# Patient Record
Sex: Male | Born: 1995 | Race: Black or African American | Hispanic: No | Marital: Single | Smoking: Never smoker
Health system: Southern US, Community
[De-identification: ages and names within clinical notes are randomized; demographics above are authoritative.]

## PROBLEM LIST (undated history)

## (undated) DIAGNOSIS — M109 Gout, unspecified: Secondary | ICD-10-CM

## (undated) DIAGNOSIS — I422 Other hypertrophic cardiomyopathy: Secondary | ICD-10-CM

---

## 2018-07-31 ENCOUNTER — Encounter: Payer: Self-pay | Admitting: Emergency Medicine

## 2018-07-31 ENCOUNTER — Other Ambulatory Visit: Payer: Self-pay

## 2018-07-31 ENCOUNTER — Emergency Department: Payer: Self-pay

## 2018-07-31 ENCOUNTER — Emergency Department
Admission: EM | Admit: 2018-07-31 | Discharge: 2018-07-31 | Disposition: A | Discharge status: Home / Self Care | Payer: Self-pay | Attending: Emergency Medicine | Admitting: Emergency Medicine

## 2018-07-31 DIAGNOSIS — R457 State of emotional shock and stress, unspecified: Secondary | ICD-10-CM

## 2018-07-31 DIAGNOSIS — R079 Chest pain, unspecified: Secondary | ICD-10-CM | POA: Insufficient documentation

## 2018-07-31 DIAGNOSIS — F430 Acute stress reaction: Secondary | ICD-10-CM | POA: Insufficient documentation

## 2018-07-31 DIAGNOSIS — Z79899 Other long term (current) drug therapy: Secondary | ICD-10-CM | POA: Insufficient documentation

## 2018-07-31 HISTORY — DX: Other hypertrophic cardiomyopathy: I42.2

## 2018-07-31 LAB — CBC
HCT: 46.1 % (ref 40.0–52.0)
Hemoglobin: 16.3 g/dL (ref 13.0–18.0)
MCH: 30.5 pg (ref 26.0–34.0)
MCHC: 35.3 g/dL (ref 32.0–36.0)
MCV: 86.3 fL (ref 80.0–100.0)
Platelets: 254 10*3/uL (ref 150–440)
RBC: 5.35 MIL/uL (ref 4.40–5.90)
RDW: 13.2 % (ref 11.5–14.5)
WBC: 11.1 10*3/uL — ABNORMAL HIGH (ref 3.8–10.6)

## 2018-07-31 LAB — COMPREHENSIVE METABOLIC PANEL
ALT: 64 U/L — AB (ref 0–44)
AST: 42 U/L — ABNORMAL HIGH (ref 15–41)
Albumin: 4.5 g/dL (ref 3.5–5.0)
Alkaline Phosphatase: 69 U/L (ref 38–126)
Anion gap: 10 (ref 5–15)
BUN: 15 mg/dL (ref 6–20)
CALCIUM: 9.4 mg/dL (ref 8.9–10.3)
CO2: 27 mmol/L (ref 22–32)
Chloride: 104 mmol/L (ref 98–111)
Creatinine, Ser: 1.01 mg/dL (ref 0.61–1.24)
GFR calc non Af Amer: >60 mL/min (ref >60)
GLUCOSE: 113 mg/dL — AB (ref 70–99)
Potassium: 3.7 mmol/L (ref 3.5–5.1)
SODIUM: 141 mmol/L (ref 135–145)
Total Bilirubin: 0.5 mg/dL (ref 0.3–1.2)
Total Protein: 8.3 g/dL — ABNORMAL HIGH (ref 6.5–8.1)

## 2018-07-31 LAB — FIBRIN DERIVATIVES D-DIMER (ARMC ONLY): FIBRIN DERIVATIVES D-DIMER (ARMC): 132.41 ng{FEU}/mL (ref 0.00–499.00)

## 2018-07-31 LAB — TROPONIN I
Troponin I: <0.03 ng/mL (ref <0.03)
Troponin I: <0.03 ng/mL (ref <0.03)

## 2018-07-31 LAB — LIPASE, BLOOD: Lipase: 30 U/L (ref 11–51)

## 2018-07-31 MED ORDER — LORAZEPAM 2 MG/ML IJ SOLN
0.5000 mg | Freq: Once | INTRAMUSCULAR | Status: AC
  Administered 2018-07-31: 0.5 mg via INTRAVENOUS
  Filled 2018-07-31: qty 1

## 2018-07-31 NOTE — ED Provider Notes (Signed)
[facility] Emergency Department Provider Note   ____________________________________________   First MD Initiated Contact with Patient 07/31/18 [phone]     (approximate)  I have reviewed the triage vital signs and the nursing notes.   HISTORY  Chief Complaint Chest Pain    HPI Michael Davidson is a 22 y.o. male brought to the ED from home via EMS with a chief complaint of chest pain.  Patient has a history of hypertrophic cardiomyopathy diagnosed in 2016 and takes metoprolol.  He is [facility].  Reports onset of sharp, left-sided chest pain lasting 15 to 20 seconds.  Symptoms associated with shortness of breath.  States this occurred after arguing with his girlfriend.  Denies associated diaphoresis, nausea/vomiting, palpitations or dizziness.  Denies recent fever, chills, cough, congestion, abdominal pain, dysuria, diarrhea.  Denies recent trauma, travel or hormone use.   Past Medical History:  Diagnosis Date  . Hypertrophic cardiomyopathy (HCC)     There are no active problems to display for this patient.   History reviewed. No pertinent surgical history.  Prior to Admission medications   Medication Sig Start Date End Date Taking? Authorizing Provider  atenolol (TENORMIN) 50 MG tablet Take by mouth daily.    Yes [provider]    Allergies Patient has no known allergies.  History reviewed. No pertinent family history.  Social History Social History   Tobacco Use  . Smoking status: Never Smoker  . Smokeless tobacco: Never Used  Substance Use Topics  . Alcohol use: Never    Frequency: Never  . Drug use: Never    Review of Systems  Constitutional: No fever/chills Eyes: No visual changes. ENT: No sore throat. Cardiovascular: Positive for chest pain. Respiratory: Positive for shortness of breath. Gastrointestinal: No abdominal pain.  No nausea, no vomiting.  No diarrhea.  No constipation. Genitourinary:  Negative for dysuria. Musculoskeletal: Negative for back pain. Skin: Negative for rash. Neurological: Negative for headaches, focal weakness or numbness.   ____________________________________________   PHYSICAL EXAM:  VITAL SIGNS: ED Triage Vitals  Enc Vitals Group     BP 07/31/18 0351 (!) 171/100     Pulse Rate 07/31/18 0349 84     Resp 07/31/18 0349 16     Temp 07/31/18 0349 98.5 F (36.9 C)     Temp Source 07/31/18 0349 Oral     SpO2 07/31/18 0349 96 %     Weight 07/31/18 0350 255 lb (115.7 kg)     Height 07/31/18 0350 6\' 2"  (1.88 m)     Head Circumference --      Peak Flow --      Pain Score 07/31/18 0350 0     Pain Loc --      Pain Edu? --      Excl. in GC? --     Constitutional: Alert and oriented. Well appearing and in no acute distress.  Tearful. Eyes: Conjunctivae are normal. PERRL. EOMI. Head: Atraumatic. Nose: No congestion/rhinnorhea. Mouth/Throat: Mucous membranes are moist.  Oropharynx non-erythematous. Neck: No stridor.   Cardiovascular: Normal rate, regular rhythm. Grossly normal heart sounds.  Good peripheral circulation. Respiratory: Normal respiratory effort.  No retractions. Lungs CTAB. Gastrointestinal: Soft and nontender. No distention. No abdominal bruits. No CVA tenderness. Musculoskeletal: No lower extremity tenderness nor edema.  No joint effusions. Neurologic:  Normal speech and language. No gross focal neurologic deficits are appreciated. No gait instability. Skin:  Skin is warm, dry and intact. No rash noted. Psychiatric: Mood and affect  are normal. Speech and behavior are normal.  ____________________________________________   LABS (all labs ordered are listed, but only abnormal results are displayed)  Labs Reviewed  CBC - Abnormal; Notable for the following components:      Result Value   WBC 11.1 (*)    All other components within normal limits  COMPREHENSIVE METABOLIC PANEL - Abnormal; Notable for the following components:    Glucose, Bld 113 (*)    Total Protein 8.3 (*)    AST 42 (*)    ALT 64 (*)    All other components within normal limits  TROPONIN I  LIPASE, BLOOD  FIBRIN DERIVATIVES D-DIMER ([facility] ONLY)  TROPONIN I   ____________________________________________  EKG  ED ECG REPORT I, Alessandro Griep J, the attending physician, personally viewed and interpreted this ECG.   Date: 07/31/2018  EKG Time: 0350  Rate: 86  Rhythm: normal EKG, normal sinus rhythm  Axis: Normal  Intervals:none  ST&T Change: Global T wave inversion  ____________________________________________  RADIOLOGY  ED MD interpretation: No acute cardiopulmonary process  Official radiology report(s): Dg Chest 2 View  Result Date: 07/31/2018 CLINICAL DATA:  22 year old male with chest pain. EXAM: CHEST - 2 VIEW COMPARISON:  None. FINDINGS: The heart size and mediastinal contours are within normal limits. Both lungs are clear. The visualized skeletal structures are unremarkable. IMPRESSION: No active cardiopulmonary disease. Electronically Signed   By: Elgie Collard M.D.   On: 07/31/2018 04:51    ____________________________________________   PROCEDURES  Procedure(s) performed: None  Procedures  Critical Care performed: No  ____________________________________________   INITIAL IMPRESSION / ASSESSMENT AND PLAN / ED COURSE  As part of my medical decision making, I reviewed the following data within the electronic MEDICAL RECORD NUMBER Nursing notes reviewed and incorporated, Labs reviewed, EKG interpreted, Radiograph reviewed and Notes from prior ED visits   22 year old male with hypertrophic cardiomyopathy who presents with 15 seconds of sharp left-sided chest pain after arguing with his girlfriend. Differential diagnosis includes, but is not limited to, ACS, aortic dissection, pulmonary embolism, cardiac tamponade, pneumothorax, pneumonia, pericarditis, myocarditis, GI-related causes including esophagitis/gastritis, and  musculoskeletal chest wall pain.    Girlfriend at bedside; they are not currently arguing.  Patient is tearful.  Denies recurrence of chest pain since initial onset approximately 30 minutes prior to arrival.  At this time will administer 0.5 mg IV Ativan for calming.  For some reason I am unable to see patient's [facility] records.  We will try to obtain old EKG for comparison purposes.  Will check cardiac panel including d-dimer.  Will reassess.  Clinical Course as of Jul 31 632  Sun Jul 31, 2018  1610 Initial laboratory results noted.  Will repeat timed troponin.  Patient currently resting in no acute distress.   [JS]  0631 Patient sleeping in no acute distress.  Updated patient and girlfriend of negative repeat troponin.  He will follow-up with his cardiologist early next week.  Strict return precautions given.  Patient verbalizes understanding and agrees with plan of care.   [JS]    Clinical Course User Index [JS] Irean Hong, MD     ____________________________________________   FINAL CLINICAL IMPRESSION(S) / ED DIAGNOSES  Final diagnoses:  Nonspecific chest pain  Emotional stress     ED Discharge Orders    None       Note:  This document was prepared using Dragon voice recognition software and may include unintentional dictation errors.    Irean Hong, MD 07/31/18 [phone]

## 2018-07-31 NOTE — ED Triage Notes (Signed)
Pt states onset of left sided sharp chest pain at 30 min pta. Pt states was shob at that time. Pt was given 324mg  asa by ems. Pt with history of cardiac disease.

## 2018-07-31 NOTE — Discharge Instructions (Addendum)
Continue your medicine as directed by your doctor.  Return to the ER for worsening symptoms, persistent vomiting, difficulty breathing or other concerns.

## 2018-07-31 NOTE — ED Notes (Signed)
Pt updated on care plan. Pt verbalizes understanding. Additional warm blankets provided to pt and visitor. Lights dimmed for comfort.

## 2018-10-15 ENCOUNTER — Emergency Department
Admission: EM | Admit: 2018-10-15 | Discharge: 2018-10-15 | Disposition: A | Discharge status: Home / Self Care | Payer: Self-pay | Attending: Emergency Medicine | Admitting: Emergency Medicine

## 2018-10-15 ENCOUNTER — Other Ambulatory Visit: Payer: Self-pay

## 2018-10-15 ENCOUNTER — Emergency Department: Payer: Self-pay

## 2018-10-15 ENCOUNTER — Encounter: Payer: Self-pay | Admitting: Emergency Medicine

## 2018-10-15 DIAGNOSIS — Z79899 Other long term (current) drug therapy: Secondary | ICD-10-CM | POA: Insufficient documentation

## 2018-10-15 DIAGNOSIS — M10072 Idiopathic gout, left ankle and foot: Secondary | ICD-10-CM | POA: Insufficient documentation

## 2018-10-15 LAB — CBC WITH DIFFERENTIAL/PLATELET
Abs Immature Granulocytes: 0.05 10*3/uL (ref 0.00–0.07)
BASOS ABS: 0 10*3/uL (ref 0.0–0.1)
BASOS PCT: 0 %
EOS ABS: 0.3 10*3/uL (ref 0.0–0.5)
EOS PCT: 3 %
HEMATOCRIT: 52.3 % — AB (ref 39.0–52.0)
Hemoglobin: 17.5 g/dL — ABNORMAL HIGH (ref 13.0–17.0)
IMMATURE GRANULOCYTES: 0 %
Lymphocytes Relative: 36 %
Lymphs Abs: 4 10*3/uL (ref 0.7–4.0)
MCH: 29 pg (ref 26.0–34.0)
MCHC: 33.5 g/dL (ref 30.0–36.0)
MCV: 86.7 fL (ref 80.0–100.0)
MONOS PCT: 7 %
Monocytes Absolute: 0.8 10*3/uL (ref 0.1–1.0)
NEUTROS PCT: 54 %
NRBC: 0 % (ref 0.0–0.2)
Neutro Abs: 6.1 10*3/uL (ref 1.7–7.7)
PLATELETS: 311 10*3/uL (ref 150–400)
RBC: 6.03 MIL/uL — ABNORMAL HIGH (ref 4.22–5.81)
RDW: 12.4 % (ref 11.5–15.5)
WBC: 11.4 10*3/uL — ABNORMAL HIGH (ref 4.0–10.5)

## 2018-10-15 LAB — BASIC METABOLIC PANEL
ANION GAP: 9 (ref 5–15)
BUN: 18 mg/dL (ref 6–20)
CALCIUM: 9.6 mg/dL (ref 8.9–10.3)
CO2: 29 mmol/L (ref 22–32)
CREATININE: 0.95 mg/dL (ref 0.61–1.24)
Chloride: 102 mmol/L (ref 98–111)
GFR calc Af Amer: >60 mL/min (ref >60)
GLUCOSE: 97 mg/dL (ref 70–99)
Potassium: 3.7 mmol/L (ref 3.5–5.1)
Sodium: 140 mmol/L (ref 135–145)

## 2018-10-15 LAB — URIC ACID: Uric Acid, Serum: 12.4 mg/dL — ABNORMAL HIGH (ref 3.7–8.6)

## 2018-10-15 LAB — SEDIMENTATION RATE: Sed Rate: 2 mm/hr (ref 0–15)

## 2018-10-15 MED ORDER — NAPROXEN 500 MG PO TABS: 500.0000 mg | ORAL_TABLET | Freq: Two times a day (BID) | ORAL | Status: DC

## 2018-10-15 MED ORDER — COLCHICINE 0.6 MG PO TABS: 0.6000 mg | ORAL_TABLET | Freq: Every day | ORAL | 2 refills | Status: DC

## 2018-10-15 MED ORDER — NAPROXEN 500 MG PO TABS
500.0000 mg | ORAL_TABLET | Freq: Once | ORAL | Status: AC
  Administered 2018-10-15: 500 mg via ORAL
  Filled 2018-10-15: qty 1

## 2018-10-15 MED ORDER — COLCHICINE 0.6 MG PO TABS
1.2000 mg | ORAL_TABLET | Freq: Once | ORAL | Status: AC
  Administered 2018-10-15: 1.2 mg via ORAL
  Filled 2018-10-15: qty 2

## 2018-10-15 NOTE — ED Triage Notes (Signed)
L foot pain since yesterday with no known injury.

## 2018-10-15 NOTE — ED Notes (Signed)
Pt verbalized understanding of discharge instructions. NAD at this time. 

## 2018-10-15 NOTE — ED Provider Notes (Signed)
[facility] Emergency Department Provider Note   ____________________________________________   First MD Initiated Contact with Patient 10/15/18 [phone]     (approximate)  I have reviewed the triage vital signs and the nursing notes.   HISTORY  Chief Complaint Foot Pain    HPI Michael Davidson is a 22 y.o. male patient awakened yesterday with non-provocative dorsal left foot pain.  Patient worked throughout the day we got home pain increased.  Patient awake this morning with increased edema and erythema.  Patient the pain increases with weightbearing.  Patient rates pain as a 9/10.  Patient described the pain is "aching".  No palliative measures for complaint.   Past Medical History:  Diagnosis Date  . Hypertrophic cardiomyopathy (HCC)     There are no active problems to display for this patient.   History reviewed. No pertinent surgical history.  Prior to Admission medications   Medication Sig Start Date End Date Taking? Authorizing Provider  atenolol (TENORMIN) 50 MG tablet Take by mouth daily.     [provider]  colchicine 0.6 MG tablet Take 1 tablet (0.6 mg total) by mouth daily. 10/15/18 10/15/19  Joni ReiningSmith, Kharee Lesesne K, PA-C  naproxen (NAPROSYN) 500 MG tablet Take 1 tablet (500 mg total) by mouth 2 (two) times daily with a meal. 10/15/18   Joni ReiningSmith, Trinton Prewitt K, PA-C    Allergies Patient has no known allergies.  No family history on file.  Social History Social History   Tobacco Use  . Smoking status: Never Smoker  . Smokeless tobacco: Never Used  Substance Use Topics  . Alcohol use: Never    Frequency: Never  . Drug use: Never    Review of Systems Constitutional: No fever/chills Eyes: No visual changes. ENT: No sore throat. Cardiovascular: Denies chest pain. Respiratory: Denies shortness of breath. Gastrointestinal: No abdominal pain.  No nausea, no vomiting.  No diarrhea.  No constipation. Genitourinary: Negative for  dysuria. Musculoskeletal: Left dorsal foot pain.  Skin: Negative for rash. Neurological: Negative for headaches, focal weakness or numbness.  ____________________________________________   PHYSICAL EXAM:  VITAL SIGNS: ED Triage Vitals [10/15/18 0949]  Enc Vitals Group     BP (!) 148/92     Pulse Rate 90     Resp 20     Temp 97.8 F (36.6 C)     Temp Source Oral     SpO2 97 %     Weight 258 lb (117 kg)     Height 6\' 1"  (1.854 m)     Head Circumference      Peak Flow      Pain Score 9     Pain Loc      Pain Edu?      Excl. in GC?     Constitutional: Alert and oriented. Well appearing and in no acute distress. Cardiovascular: Normal rate, regular rhythm. Grossly normal heart sounds.  Good peripheral circulation. Respiratory: Normal respiratory effort.  No retractions. Lungs CTAB. Musculoskeletal: No obvious deformity to the left foot.  Patient has moderate guarding palpation dorsal aspect of the foot.  Mild edema and erythema. Neurologic:  Normal speech and language. No gross focal neurologic deficits are appreciated. No gait instability. Skin:  Skin is warm, dry and intact. No rash noted. Psychiatric: Mood and affect are normal. Speech and behavior are normal.  ____________________________________________   LABS (all labs ordered are listed, but only abnormal results are displayed)  Labs Reviewed  CBC WITH DIFFERENTIAL/PLATELET - Abnormal; Notable for the following  components:      Result Value   WBC 11.4 (*)    RBC 6.03 (*)    Hemoglobin 17.5 (*)    HCT 52.3 (*)    All other components within normal limits  URIC ACID - Abnormal; Notable for the following components:   Uric Acid, Serum 12.4 (*)    All other components within normal limits  BASIC METABOLIC PANEL  SEDIMENTATION RATE   ____________________________________________  EKG   ____________________________________________  RADIOLOGY  ED MD interpretation:    Official radiology report(s): Dg Foot  Complete Left  Result Date: 10/15/2018 CLINICAL DATA:  Initial encounter for Pain to top of left foot with no known injury x1 day. No hx of prior injuries. EXAM: LEFT FOOT - COMPLETE 3+ VIEW COMPARISON:  None. FINDINGS: Mild hallux valgus deformity. No acute fracture or dislocation. No periosteal reaction or callus deposition. IMPRESSION: No acute osseous abnormality. Electronically Signed   By: Jeronimo Greaves M.D.   On: 10/15/2018 10:52    ____________________________________________   PROCEDURES  Procedure(s) performed: None  Procedures  Critical Care performed: No  ____________________________________________   INITIAL IMPRESSION / ASSESSMENT AND PLAN / ED COURSE  As part of my medical decision making, I reviewed the following data within the electronic MEDICAL RECORD NUMBER   Left foot pain secondary to gout.  Discussed x-rays and lab results with patient.  Patient given discharge care instruction advised take medication as directed.  Patient advised to follow-up with family doctor.  Patient given a work note.       ____________________________________________   FINAL CLINICAL IMPRESSION(S) / ED DIAGNOSES  Final diagnoses:  Acute idiopathic gout of left foot     ED Discharge Orders         Ordered    colchicine 0.6 MG tablet  Daily     10/15/18 1130    naproxen (NAPROSYN) 500 MG tablet  2 times daily with meals     10/15/18 1130           Note:  This document was prepared using Dragon voice recognition software and may include unintentional dictation errors.    Joni Reining, PA-C 10/15/18 1132    Schaevitz, Myra Rude, MD 10/15/18 [phone]

## 2019-07-04 IMAGING — DX DG FOOT COMPLETE 3+V*L*
3 series · 3 of 3 positions shown · non-contrast
Comparison: None.

CLINICAL DATA: Initial encounter for Pain to top of left foot with
no known injury x1 day. No hx of prior injuries.

EXAM:
LEFT FOOT - COMPLETE 3+ VIEW

[foot ap]
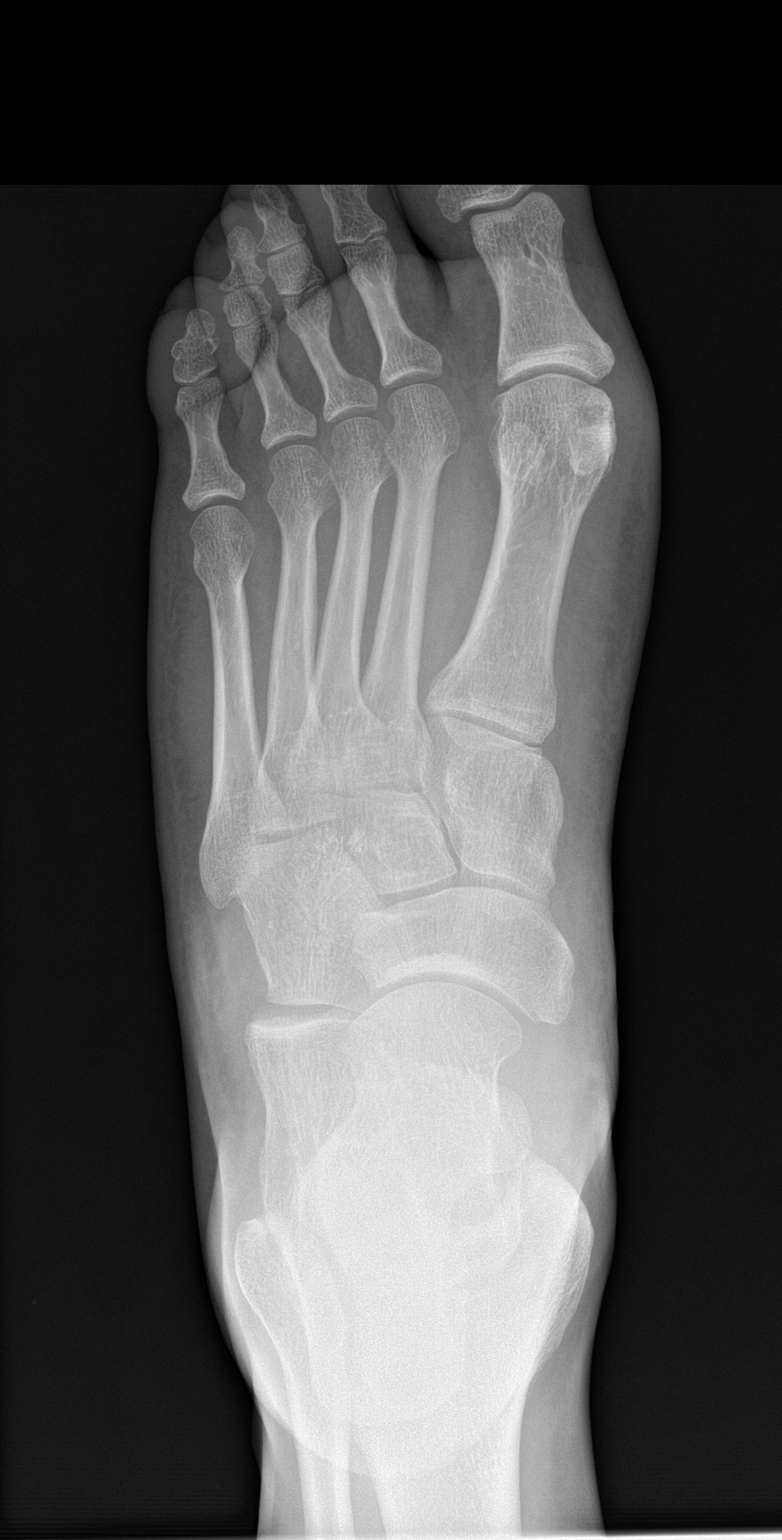

[foot obl]
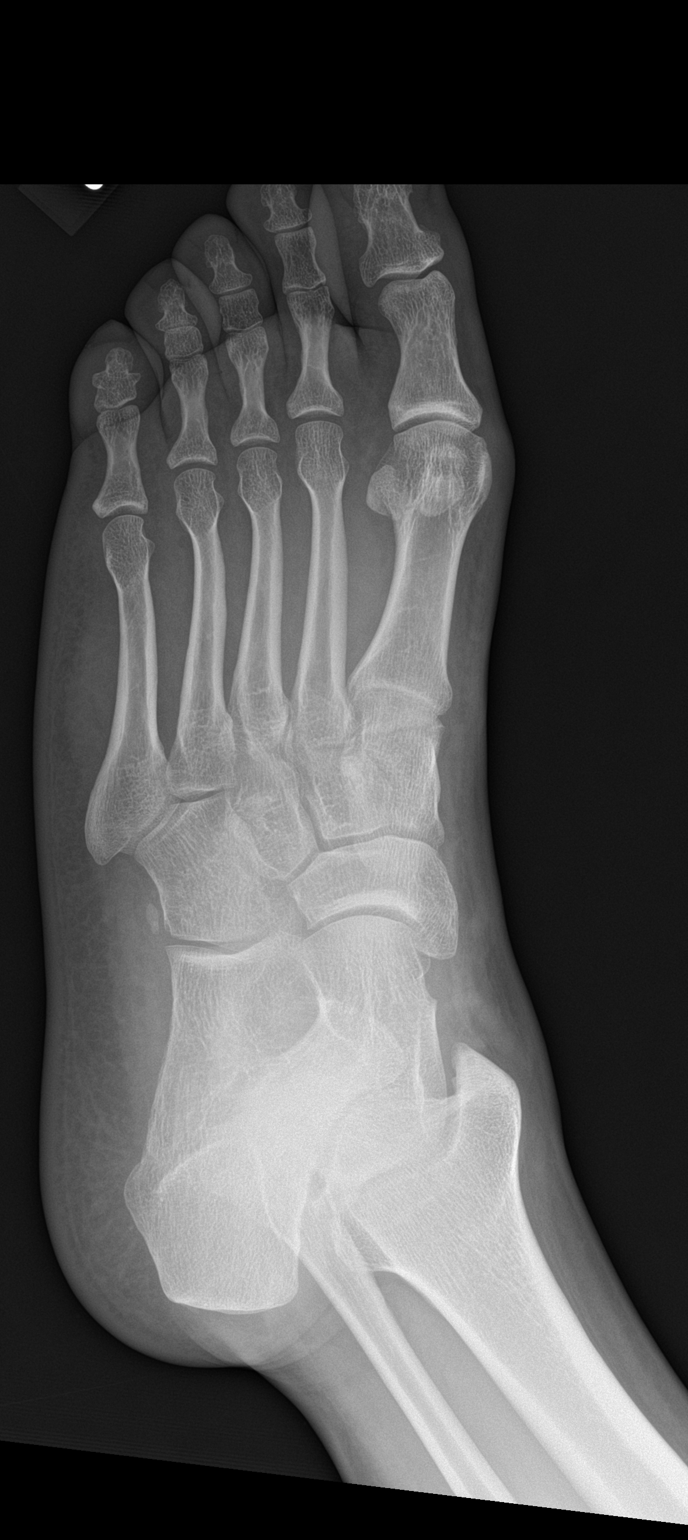

[foot lat]
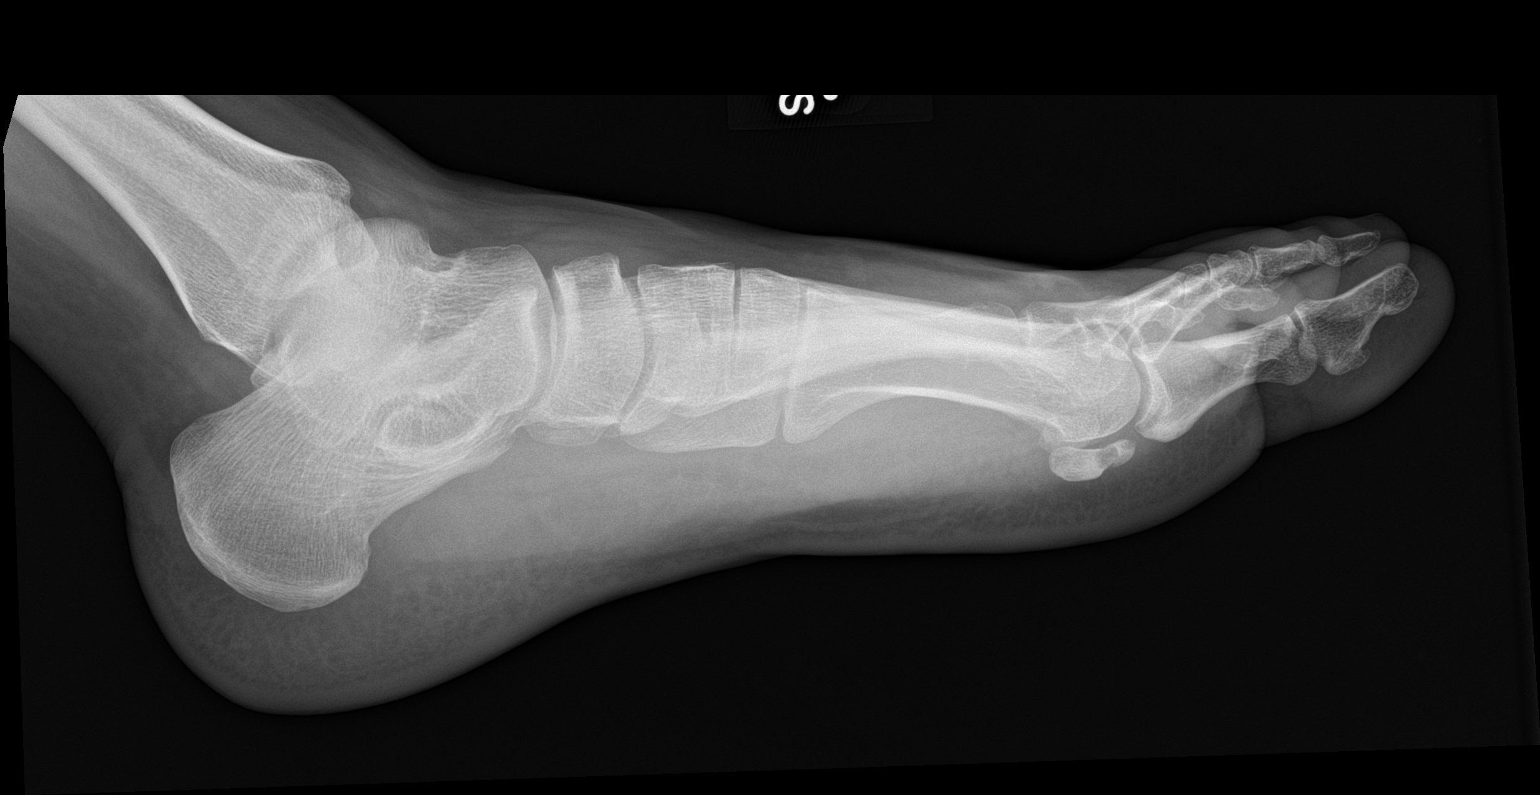

[3 of 3 positions shown; findings below may reference images not displayed]

FINDINGS: Mild hallux valgus deformity. No acute fracture or dislocation. No
periosteal reaction or callus deposition.
IMPRESSION: No acute osseous abnormality.

## 2021-10-22 ENCOUNTER — Other Ambulatory Visit: Payer: Self-pay

## 2021-10-22 ENCOUNTER — Emergency Department
Admission: EM | Admit: 2021-10-22 | Discharge: 2021-10-23 | Disposition: A | Discharge status: Home / Self Care | Payer: BC Managed Care – PPO | Attending: Emergency Medicine | Admitting: Emergency Medicine

## 2021-10-22 DIAGNOSIS — Z760 Encounter for issue of repeat prescription: Secondary | ICD-10-CM | POA: Diagnosis not present

## 2021-10-22 DIAGNOSIS — M109 Gout, unspecified: Secondary | ICD-10-CM | POA: Insufficient documentation

## 2021-10-22 HISTORY — DX: Gout, unspecified: M10.9

## 2021-10-22 NOTE — ED Triage Notes (Signed)
Pt in with co right foot pain, has hx of gout. States ran out of prescription and needs refill for colchicine.

## 2021-10-23 ENCOUNTER — Encounter: Payer: Self-pay | Admitting: Emergency Medicine

## 2021-10-23 MED ORDER — COLCHICINE 0.6 MG PO TABS: ORAL_TABLET | ORAL | 2 refills | Status: AC

## 2021-10-23 NOTE — ED Notes (Signed)
E-signature pad unavailable - Pt verbalized understanding of D/C information - no additional concerns at this time.  

## 2021-10-23 NOTE — ED Provider Notes (Signed)
[facility] Emergency Department Provider Note  ____________________________________________   Event Date/Time   First MD Initiated Contact with Patient 10/23/21 0004     (approximate)  I have reviewed the triage vital signs and the nursing notes.   HISTORY  Chief Complaint Gout    HPI Michael Davidson is a 25 y.o. male with medical history as listed below who presents for a refill on his colchicine.  In addition to his hypertrophic cardiomyopathy, for which he sees a cardiologist in [place], he has a history of gout.  He said that if he takes colchicine as soon as the symptoms start, it usually goes away within a couple of days.  He is out of colchicine and he is in the process of establishing a new primary care provider in [place] because he just recently moved from [place].  He has been having some mild pain and swelling in his right ankle consistent with prior gout flares and he wants to get it under control quickly.  However he is ambulatory without any difficulty.  No recent fevers.  No concerns other than the possible development of a mild gout flare.     Past Medical History:  Diagnosis Date   Gout    Hypertrophic cardiomyopathy (HCC)     There are no problems to display for this patient.   History reviewed. No pertinent surgical history.  Prior to Admission medications   Medication Sig Start Date End Date Taking? Authorizing Provider  colchicine 0.6 MG tablet Take 1.2 mg PO (2 tablets) at the first sign of gout flare, followed by 0.6 mg PO (1 tablet) one hour after the first dose. Then take 0.6 mg PO (1 tablet) daily until flare resolves. 10/23/21  Yes Loleta Rose, MD  atenolol (TENORMIN) 50 MG tablet Take by mouth daily.     [provider]  naproxen (NAPROSYN) 500 MG tablet Take 1 tablet (500 mg total) by mouth 2 (two) times daily with a meal. 10/15/18   Joni Reining, PA-C    Allergies Patient has no known allergies.  History  reviewed. No pertinent family history.  Social History Social History   Tobacco Use   Smoking status: Never   Smokeless tobacco: Never  Vaping Use   Vaping Use: Never used  Substance Use Topics   Alcohol use: Never   Drug use: Never    Review of Systems Constitutional: No fever/chills Cardiovascular: Denies chest pain. Respiratory: Denies shortness of breath. Musculoskeletal: Mild pain and swelling in right ankle, otherwise no complaints. Integumentary: Negative for rash. Neurological: Negative for headaches, focal weakness or numbness.   ____________________________________________   PHYSICAL EXAM:  VITAL SIGNS: ED Triage Vitals  Enc Vitals Group     BP 10/22/21 2223 (!) 167/92     Pulse Rate 10/22/21 2221 76     Resp 10/22/21 2221 20     Temp 10/22/21 2221 98 F (36.7 C)     Temp Source 10/22/21 2221 Oral     SpO2 10/22/21 2221 100 %     Weight 10/22/21 2221 115.2 kg (254 lb)     Height 10/22/21 2221 1.88 m (6\' 2" )     Head Circumference --      Peak Flow --      Pain Score 10/22/21 2221 2     Pain Loc --      Pain Edu? --      Excl. in GC? --     Constitutional: Alert and oriented.  Eyes: Conjunctivae are normal.  Cardiovascular: Normal rate, regular rhythm. Good peripheral circulation. Respiratory: Normal respiratory effort.  No retractions. Musculoskeletal: Mild swelling and slight tenderness to palpation of the right ankle consistent with his concerns for early gout flare, although the small amount of swelling could be chronic as well.  No concern for septic joint.  Ambulatory without difficulty. Neurologic:  Normal speech and language. No gross focal neurologic deficits are appreciated.  Skin:  Skin is warm, dry and intact. Psychiatric: Mood and affect are normal. Speech and behavior are normal.  ____________________________________________    INITIAL IMPRESSION / MDM / ASSESSMENT AND PLAN / ED COURSE  As part of my medical decision making, I  reviewed the following data within the electronic MEDICAL RECORD NUMBER Nursing notes reviewed and incorporated, Old chart reviewed, and Notes from prior ED visits   Patient is concerned about having colchicine on hand in case his ankle is developing into a gout flare.  I think this is reasonable particularly because he is in between primary care providers.  No indication that he has an emergent medical condition tonight.  Vital signs are stable other than hypertension and he has a cardiologist and is taking atenolol and hydrochlorothiazide.  I talked with the patient about treatment now versus getting a prescription that he can fill in the morning and he agrees with the plan for the prescription.  I gave him a course of colchicine that should be sufficient for a mild gout flare with a couple of refills and I gave him information about how to follow-up to establish a primary care doctor with [facility].  He understands and agrees with the plan.          ____________________________________________  FINAL CLINICAL IMPRESSION(S) / ED DIAGNOSES  Final diagnoses:  Acute gout of right ankle, unspecified cause     MEDICATIONS GIVEN DURING THIS VISIT:  Medications - No data to display   ED Discharge Orders          Ordered    colchicine 0.6 MG tablet        10/23/21 0058             Note:  This document was prepared using Dragon voice recognition software and may include unintentional dictation errors.   Loleta Rose, MD 10/23/21 [phone]

## 2022-06-02 ENCOUNTER — Emergency Department: Payer: BC Managed Care – PPO

## 2022-06-02 ENCOUNTER — Emergency Department
Admission: EM | Admit: 2022-06-02 | Discharge: 2022-06-02 | Disposition: A | Discharge status: Home / Self Care | Payer: BC Managed Care – PPO | Attending: Emergency Medicine | Admitting: Emergency Medicine

## 2022-06-02 ENCOUNTER — Encounter: Payer: Self-pay | Admitting: Emergency Medicine

## 2022-06-02 ENCOUNTER — Other Ambulatory Visit: Payer: Self-pay

## 2022-06-02 DIAGNOSIS — M25561 Pain in right knee: Secondary | ICD-10-CM | POA: Diagnosis present

## 2022-06-02 DIAGNOSIS — M25461 Effusion, right knee: Secondary | ICD-10-CM | POA: Insufficient documentation

## 2022-06-02 MED ORDER — MELOXICAM 15 MG PO TABS: 15.0000 mg | ORAL_TABLET | Freq: Every day | ORAL | 0 refills | Status: AC

## 2022-06-02 MED ORDER — KETOROLAC TROMETHAMINE 30 MG/ML IJ SOLN
30.0000 mg | Freq: Once | INTRAMUSCULAR | Status: AC
  Administered 2022-06-02: 30 mg via INTRAMUSCULAR
  Filled 2022-06-02: qty 1

## 2022-06-02 NOTE — ED Provider Notes (Signed)
[facility] Provider Note  Patient Contact: 8:36 PM (approximate)   History   Knee Pain   HPI  Michael Davidson is a 26 y.o. male presents to the emergency department with right knee pain and swelling.  Patient states that he has worsening pain with ambulation and improvement with rest.  He has not noticed any erythema of the right knee.  He states that he does have a history of gout but typically has gout in the right great toe and the right ankle and has never had gout in the knee.  He denies any pleuritic chest pain or shortness of breath and states that he lives a very active lifestyle.  He denies pain in his calf.  No recent surgery or daily smoking.      Physical Exam   Triage Vital Signs: ED Triage Vitals  Enc Vitals Group     BP 06/02/22 1613 138/85     Pulse Rate 06/02/22 1613 93     Resp 06/02/22 1613 18     Temp 06/02/22 1613 98 F (36.7 C)     Temp Source 06/02/22 1613 Oral     SpO2 06/02/22 1613 96 %     Weight 06/02/22 1612 255 lb (115.7 kg)     Height 06/02/22 1612 6\' 2"  (1.88 m)     Head Circumference --      Peak Flow --      Pain Score 06/02/22 1612 0     Pain Loc --      Pain Edu? --      Excl. in GC? --     Most recent vital signs: Vitals:   06/02/22 1613  BP: 138/85  Pulse: 93  Resp: 18  Temp: 98 F (36.7 C)  SpO2: 96%     General: Alert and in no acute distress. Eyes:  PERRL. EOMI. Head: No acute traumatic findings ENT:      Nose: No congestion/rhinnorhea.      Mouth/Throat: Mucous membranes are moist.  Neck: No stridor. No cervical spine tenderness to palpation. Cardiovascular:  Good peripheral perfusion Respiratory: Normal respiratory effort without tachypnea or retractions. Lungs CTAB. Good air entry to the bases with no decreased or absent breath sounds. Gastrointestinal: Bowel sounds 4 quadrants. Soft and nontender to palpation. No guarding or rigidity. No palpable masses. No distention. No CVA  tenderness. Musculoskeletal: Patient can flex and extend right knee easily with no tenderness along the posterior right knee.  No other deficits noted with provocative testing.  Palpable dorsalis pedis pulse bilaterally and symmetrically. Neurologic:  No gross focal neurologic deficits are appreciated.  Skin:   No rash noted Other:   ED Results / Procedures / Treatments   Labs (all labs ordered are listed, but only abnormal results are displayed) Labs Reviewed - No data to display      RADIOLOGY  I personally viewed and evaluated these images as part of my medical decision making, as well as reviewing the written report by the radiologist.  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   PROCEDURES:  Critical Care performed: No  Procedures   MEDICATIONS ORDERED IN ED: Medications  ketorolac (TORADOL) 30 MG/ML injection 30 mg (30 mg Intramuscular Given 06/02/22 1825)     IMPRESSION / MDM / ASSESSMENT AND PLAN / ED COURSE  I reviewed the triage vital signs and the nursing notes.  Assessment and plan Knee pain Differential diagnosis included septic knee, DVT, osteoarthritis, gout.  26 year old male with history of gout presents to the emergency department with right knee pain.  Patient has an extremely physically demanding job and I suspect overuse injury.  Patient did have evidence of tricompartmental arthritis on exam.  Patient has no overlying erythema or changes in range of motion or calf pain to increase my suspicion for DVT or septic arthritis.  I offered several different management strategies including arthrocentesis and anti-inflammatory/steroid.  Patient preferred IM Toradol in the emergency department and p.o. meloxicam at discharge.  Recommended orthopedics follow-up.  Return precautions were given to return with new or worsening symptoms.      FINAL CLINICAL IMPRESSION(S) / ED DIAGNOSES    Final diagnoses:  Acute pain of right knee     Rx / DC Orders   ED Discharge Orders          Ordered    meloxicam (MOBIC) 15 MG tablet  Daily        06/02/22 1818             Note:  This document was prepared using Dragon voice recognition software and may include unintentional dictation errors.   Pia Mau [place], Cordelia Poche 06/02/22 2040    Willy Eddy, MD 06/04/22 2039

## 2022-06-02 NOTE — Discharge Instructions (Addendum)
Take Meloxicam once daily for pain and inflammation.  

## 2022-06-02 NOTE — ED Triage Notes (Signed)
Patient c/o right knee pain and swelling x 1 week. States he works for [employer] and is on it a lot but no known injury.

## 2022-10-05 ENCOUNTER — Encounter (HOSPITAL_BASED_OUTPATIENT_CLINIC_OR_DEPARTMENT_OTHER): Payer: Self-pay | Admitting: Urology

## 2022-10-05 ENCOUNTER — Emergency Department (HOSPITAL_BASED_OUTPATIENT_CLINIC_OR_DEPARTMENT_OTHER): Payer: BC Managed Care – PPO

## 2022-10-05 ENCOUNTER — Emergency Department (HOSPITAL_BASED_OUTPATIENT_CLINIC_OR_DEPARTMENT_OTHER)
Admission: EM | Admit: 2022-10-05 | Discharge: 2022-10-05 | Disposition: A | Discharge status: Home / Self Care | Payer: BC Managed Care – PPO | Attending: Emergency Medicine | Admitting: Emergency Medicine

## 2022-10-05 ENCOUNTER — Other Ambulatory Visit: Payer: Self-pay

## 2022-10-05 DIAGNOSIS — X500XXA Overexertion from strenuous movement or load, initial encounter: Secondary | ICD-10-CM | POA: Insufficient documentation

## 2022-10-05 DIAGNOSIS — S93402A Sprain of unspecified ligament of left ankle, initial encounter: Secondary | ICD-10-CM | POA: Diagnosis not present

## 2022-10-05 DIAGNOSIS — S99912A Unspecified injury of left ankle, initial encounter: Secondary | ICD-10-CM | POA: Diagnosis present

## 2022-10-05 NOTE — ED Provider Notes (Signed)
[facility] EMERGENCY DEPARTMENT Provider Note   CSN: [id number] Arrival date & time: 10/05/22  1946     History  Chief Complaint  Patient presents with   Ankle Pain    Anav Lammert is a 26 y.o. male.  Patient here with left ankle pain after rolling his ankle yesterday.  Been ambulatory but slightly worse pain.  He is injured his ankle multiple times in the past.  Denies any fevers or chills.  No significant medical history.  He has been able to use a cane today.  The history is provided by the patient.       Home Medications Prior to Admission medications   Medication Sig Start Date End Date Taking? Authorizing Provider  atenolol (TENORMIN) 50 MG tablet Take by mouth daily.     [provider]  colchicine 0.6 MG tablet Take 1.2 mg PO (2 tablets) at the first sign of gout flare, followed by 0.6 mg PO (1 tablet) one hour after the first dose. Then take 0.6 mg PO (1 tablet) daily until flare resolves. 10/23/21   Loleta Rose, MD  naproxen (NAPROSYN) 500 MG tablet Take 1 tablet (500 mg total) by mouth 2 (two) times daily with a meal. 10/15/18   Joni Reining, PA-C      Allergies    Patient has no known allergies.    Review of Systems   Review of Systems  Physical Exam Updated Vital Signs BP (!) 178/116 (BP Location: Left Arm)   Pulse (!) 116   Temp 98.6 F (37 C)   Resp 14   Ht 6\' 2"  (1.88 m)   Wt 115.7 kg   SpO2 96%   BMI 32.75 kg/m  Physical Exam Vitals and nursing note reviewed.  Constitutional:      General: He is not in acute distress.    Appearance: He is well-developed.  HENT:     Mouth/Throat:     Mouth: Mucous membranes are moist.  Eyes:     Extraocular Movements: Extraocular movements intact.     Conjunctiva/sclera: Conjunctivae normal.     Pupils: Pupils are equal, round, and reactive to light.  Cardiovascular:     Rate and Rhythm: Normal rate and regular rhythm.     Pulses: Normal pulses.     Heart sounds: No murmur  heard. Abdominal:     Palpations: Abdomen is soft.  Musculoskeletal:        General: Swelling and tenderness present. Normal range of motion.     Cervical back: Neck supple.     Comments: Tenderness to the medial side of the left ankle, there is no warmth or redness  Skin:    General: Skin is warm and dry.     Capillary Refill: Capillary refill takes less than 2 seconds.  Neurological:     General: No focal deficit present.     Mental Status: He is alert.  Psychiatric:        Mood and Affect: Mood normal.     ED Results / Procedures / Treatments   Labs (all labs ordered are listed, but only abnormal results are displayed) Labs Reviewed - No data to display  EKG None  Radiology DG Ankle Complete Left  Result Date: 10/05/2022 CLINICAL DATA:  Pain and swelling EXAM: LEFT ANKLE COMPLETE - 3+ VIEW COMPARISON:  None Available. FINDINGS: No fracture or malalignment. Ankle mortise is symmetric. Medial greater than lateral soft tissue swelling. Cortical bone resorption and heterogeneous lucency at the medial  malleolus. No soft tissue emphysema IMPRESSION: 1. No acute fracture. 2. Soft tissue swelling medial greater than lateral, with suspicion of cortical bone resorption and heterogeneous lucency at the medial malleolus. Findings could be secondary to osteomyelitis in the appropriate clinical setting. Correlate for signs or symptoms of infection. Electronically Signed   By: Jasmine Pang M.D.   On: 10/05/2022 20:26    Procedures Procedures    Medications Ordered in ED Medications - No data to display  ED Course/ Medical Decision Making/ A&P                           Medical Decision Making Amount and/or Complexity of Data Reviewed Radiology: ordered.   Sahil Milner is here with left ankle pain after rolling ankle yesterday.  No significant medical history.  He is injured this ankle multiple times in the past.  He is got some soft tissue swelling to the left medial portion of  the ankle but otherwise exam is unremarkable.  Normal pulses.  X-ray obtained showed no fracture.  Radiology thought may be some cortical bone resorption and lucency at the medial malleolus.  I have no concerns for osteomyelitis or infectious process.  Maybe this is from multiple injuries in the past.  Overall we will put him in a walking boot.  Treating for an ankle sprain.  Recommend Tylenol, ibuprofen, ice.  Discharged in good condition.  Will have him follow-up with sports medicine.  This chart was dictated using voice recognition software.  Despite best efforts to proofread,  errors can occur which can change the documentation meaning.         Final Clinical Impression(s) / ED Diagnoses Final diagnoses:  Sprain of left ankle, unspecified ligament, initial encounter    Rx / DC Orders ED Discharge Orders     None         Virgina Norfolk, DO 10/05/22 2135

## 2022-10-05 NOTE — ED Triage Notes (Signed)
Pt states left ankle injury 3 days ago  Woke up Saturday with ankle pain  Pt reports swelling and pain with weight bearing   Htn at triage, out of atenolol and HCTZ

## 2022-10-30 ENCOUNTER — Other Ambulatory Visit: Payer: Self-pay

## 2022-10-30 ENCOUNTER — Encounter: Payer: Self-pay | Admitting: Emergency Medicine

## 2022-10-30 ENCOUNTER — Emergency Department
Admission: EM | Admit: 2022-10-30 | Discharge: 2022-10-30 | Disposition: A | Discharge status: Home / Self Care | Payer: BC Managed Care – PPO | Attending: Emergency Medicine | Admitting: Emergency Medicine

## 2022-10-30 DIAGNOSIS — K0889 Other specified disorders of teeth and supporting structures: Secondary | ICD-10-CM | POA: Diagnosis present

## 2022-10-30 DIAGNOSIS — K047 Periapical abscess without sinus: Secondary | ICD-10-CM | POA: Diagnosis not present

## 2022-10-30 MED ORDER — KETOROLAC TROMETHAMINE 10 MG PO TABS: 10.0000 mg | ORAL_TABLET | Freq: Four times a day (QID) | ORAL | 0 refills | Status: AC | PRN

## 2022-10-30 MED ORDER — AMOXICILLIN-POT CLAVULANATE 875-125 MG PO TABS: 1.0000 | ORAL_TABLET | Freq: Two times a day (BID) | ORAL | 0 refills | Status: AC

## 2022-10-30 MED ORDER — KETOROLAC TROMETHAMINE 15 MG/ML IJ SOLN
15.0000 mg | Freq: Once | INTRAMUSCULAR | Status: AC
  Administered 2022-10-30: 15 mg via INTRAMUSCULAR
  Filled 2022-10-30: qty 1

## 2022-10-30 NOTE — ED Triage Notes (Signed)
Patient ambulatory to triage with steady gait, without difficulty or distress noted; pt reports left lower dental pain tonight; has appt in 2wks for extraction

## 2022-10-30 NOTE — ED Provider Notes (Signed)
[facility] Provider Note    Event Date/Time   First MD Initiated Contact with Patient 10/30/22 [phone]     (approximate)   History   Chief Complaint: Dental Pain   HPI  Stanislav Gervase is a 26 y.o. male with a past history of HOCM who comes ED complaining of left lower jaw pain for the past few days.  Reports he has an appointment for tooth extraction in about 2 weeks.  Denies any trouble breathing or swallowing, no fever.  No neck pain or headache or vision changes.     Physical Exam   Triage Vital Signs: ED Triage Vitals  Enc Vitals Group     BP 10/30/22 0441 (!) 185/105     Pulse Rate 10/30/22 0441 90     Resp 10/30/22 0441 18     Temp 10/30/22 0441 98.1 F (36.7 C)     Temp Source 10/30/22 0441 Oral     SpO2 10/30/22 0441 100 %     Weight 10/30/22 0435 270 lb (122.5 kg)     Height 10/30/22 0435 6\' 3"  (1.905 m)     Head Circumference --      Peak Flow --      Pain Score 10/30/22 0434 8     Pain Loc --      Pain Edu? --      Excl. in [place]? --     Most recent vital signs: Vitals:   10/30/22 0441  BP: (!) 185/105  Pulse: 90  Resp: 18  Temp: 98.1 F (36.7 C)  SpO2: 100%    General: Awake, no distress.  CV:  Good peripheral perfusion.  Resp:  Normal effort.  Abd:  No distention.  Other:  No lymphadenopathy.  Tooth #17 shows DKA of the crown with surrounding hyperemia and swelling of the gingiva.  No purulent drainage.  No fluctuance.  Oropharynx is normal.  No tonsillar asymmetry or uvular deviation.   ED Results / Procedures / Treatments   Labs (all labs ordered are listed, but only abnormal results are displayed) Labs Reviewed - No data to display   EKG    RADIOLOGY    PROCEDURES:  Procedures   MEDICATIONS ORDERED IN ED: Medications  ketorolac (TORADOL) 15 MG/ML injection 15 mg (has no administration in time range)     IMPRESSION / MDM / ASSESSMENT AND PLAN / ED COURSE  I reviewed the triage vital signs and the  nursing notes.                                Patient's presentation is most consistent with acute, uncomplicated illness.  Patient presents with odontogenic infection.  Started on Augmentin, NSAIDs for pain relief.  No evidence of abscess or airway threat.  Continue follow-up with dentistry.       FINAL CLINICAL IMPRESSION(S) / ED DIAGNOSES   Final diagnoses:  Dental infection     Rx / DC Orders   ED Discharge Orders          Ordered    amoxicillin-clavulanate (AUGMENTIN) 875-125 MG tablet  2 times daily        10/30/22 0451    ketorolac (TORADOL) 10 MG tablet  Every 6 hours PRN       Note to Pharmacy: Continuation of parenteral therapy   10/30/22 0451             Note:  This  document was prepared using Conservation officer, historic buildings and may include unintentional dictation errors.   Sharman Cheek, MD 10/30/22 [phone]

## 2022-11-25 ENCOUNTER — Encounter: Payer: Self-pay | Admitting: Emergency Medicine

## 2022-11-25 DIAGNOSIS — R197 Diarrhea, unspecified: Secondary | ICD-10-CM | POA: Insufficient documentation

## 2022-11-25 NOTE — ED Triage Notes (Signed)
Pt presents via POV for a note for work. Pt had food poisoning 2 days ago and his job is requiring a note stating he can return. Pts sx have resolved. No other concerns at this time. Denies CP or SOB.

## 2022-11-26 ENCOUNTER — Emergency Department
Admission: EM | Admit: 2022-11-26 | Discharge: 2022-11-26 | Disposition: A | Discharge status: Home / Self Care | Payer: BC Managed Care – PPO | Attending: Emergency Medicine | Admitting: Emergency Medicine

## 2022-11-26 DIAGNOSIS — R197 Diarrhea, unspecified: Secondary | ICD-10-CM

## 2022-11-26 NOTE — ED Provider Notes (Signed)
   [facility] Provider Note    Event Date/Time   First MD Initiated Contact with Patient 11/26/22 0021     (approximate)   History   Letter for School/Work   HPI  Michael Davidson is a 27 y.o. male who presents for a note so that he can return to work.  He said that yesterday he had to call out because he had multiple episodes of diarrhea starting the day before yesterday.  He thinks he might of eaten something that disagreed with him.  He said he has not had any recent episodes and has not had any loose stools since yesterday morning.  He has had no nausea, vomiting, nor abdominal pain.  He works in [employer] so his boss insisted that he get a note saying that he come back.  He said he feels fine now.     Physical Exam   Triage Vital Signs: ED Triage Vitals  Enc Vitals Group     BP 11/25/22 2231 (!) 146/85     Pulse Rate 11/25/22 2231 75     Resp 11/25/22 2231 18     Temp 11/25/22 2231 97.9 F (36.6 C)     Temp Source 11/25/22 2231 Oral     SpO2 11/25/22 2231 100 %     Weight 11/25/22 2234 123.1 kg (271 lb 6.2 oz)     Height 11/25/22 2234 1.905 m (6\' 3" )     Head Circumference --      Peak Flow --      Pain Score 11/25/22 2233 0     Pain Loc --      Pain Edu? --      Excl. in [place]? --     Most recent vital signs: Vitals:   11/25/22 2231 11/26/22 0211  BP: (!) 146/85 129/78  Pulse: 75 71  Resp: 18 14  Temp: 97.9 F (36.6 C) 98 F (36.7 C)  SpO2: 100% 100%     General: Awake, no distress.  CV:  Good peripheral perfusion.  Resp:  Normal effort.  Abd:  No distention.  No tenderness to palpation. Other:  Ambulatory without difficulty.  Normal mood and affect.   ED Results / Procedures / Treatments   Labs (all labs ordered are listed, but only abnormal results are displayed) Labs Reviewed - No data to display     IMPRESSION / MDM / [place] / ED COURSE  I reviewed the triage vital signs and the nursing notes.                               Differential diagnosis includes, but is not limited to, viral illness, foodborne pathogen, bacterial enteritis or colitis.  Patient's presentation is most consistent with acute, uncomplicated illness.  Patient had a brief course of diarrhea but now feels better and is asymptomatic.  Normal vital signs.  I gave him a return to work note for today.        FINAL CLINICAL IMPRESSION(S) / ED DIAGNOSES   Final diagnoses:  Diarrhea of presumed infectious origin     Rx / DC Orders   ED Discharge Orders     None        Note:  This document was prepared using Dragon voice recognition software and may include unintentional dictation errors.   Hinda Kehr, MD 11/26/22 0230
